# Patient Record
Sex: Male | Born: 1970 | Race: Black or African American | Hispanic: No | Marital: Single | Smoking: Light tobacco smoker
Health system: Southern US, Community
[De-identification: ages and names within clinical notes are randomized; demographics above are authoritative.]

---

## 1999-01-10 ENCOUNTER — Emergency Department (HOSPITAL_COMMUNITY): Admission: EM | Admit: 1999-01-10 | Discharge: 1999-01-10 | Payer: Self-pay | Admitting: Emergency Medicine

## 1999-01-10 ENCOUNTER — Encounter: Payer: Self-pay | Admitting: Emergency Medicine

## 1999-10-20 ENCOUNTER — Emergency Department (HOSPITAL_COMMUNITY): Admission: EM | Admit: 1999-10-20 | Discharge: 1999-10-20 | Payer: Self-pay | Admitting: *Deleted

## 1999-10-20 ENCOUNTER — Encounter: Payer: Self-pay | Admitting: *Deleted

## 2001-08-11 ENCOUNTER — Emergency Department (HOSPITAL_COMMUNITY): Admission: EM | Admit: 2001-08-11 | Discharge: 2001-08-11 | Payer: Self-pay | Admitting: Emergency Medicine

## 2003-12-26 ENCOUNTER — Emergency Department (HOSPITAL_COMMUNITY): Admission: EM | Admit: 2003-12-26 | Discharge: 2003-12-26 | Payer: Self-pay | Admitting: Emergency Medicine

## 2006-12-11 ENCOUNTER — Emergency Department (HOSPITAL_COMMUNITY): Admission: EM | Admit: 2006-12-11 | Discharge: 2006-12-11 | Payer: Self-pay | Admitting: Family Medicine

## 2006-12-13 ENCOUNTER — Emergency Department (HOSPITAL_COMMUNITY): Admission: EM | Admit: 2006-12-13 | Discharge: 2006-12-13 | Payer: Self-pay | Admitting: Emergency Medicine

## 2006-12-15 ENCOUNTER — Emergency Department (HOSPITAL_COMMUNITY): Admission: EM | Admit: 2006-12-15 | Discharge: 2006-12-15 | Payer: Self-pay | Admitting: Family Medicine

## 2008-08-20 IMAGING — CR DG FOOT COMPLETE 3+V*L*
3 series · 3 of 3 positions shown · non-contrast
Comparison: None.

CLINICAL DATA: Left foot injury.  Stepped on a pipe [REDACTED] with pain. 
 LEFT FOOT ? 3 VIEW:

[view not recorded (1 of 3)]
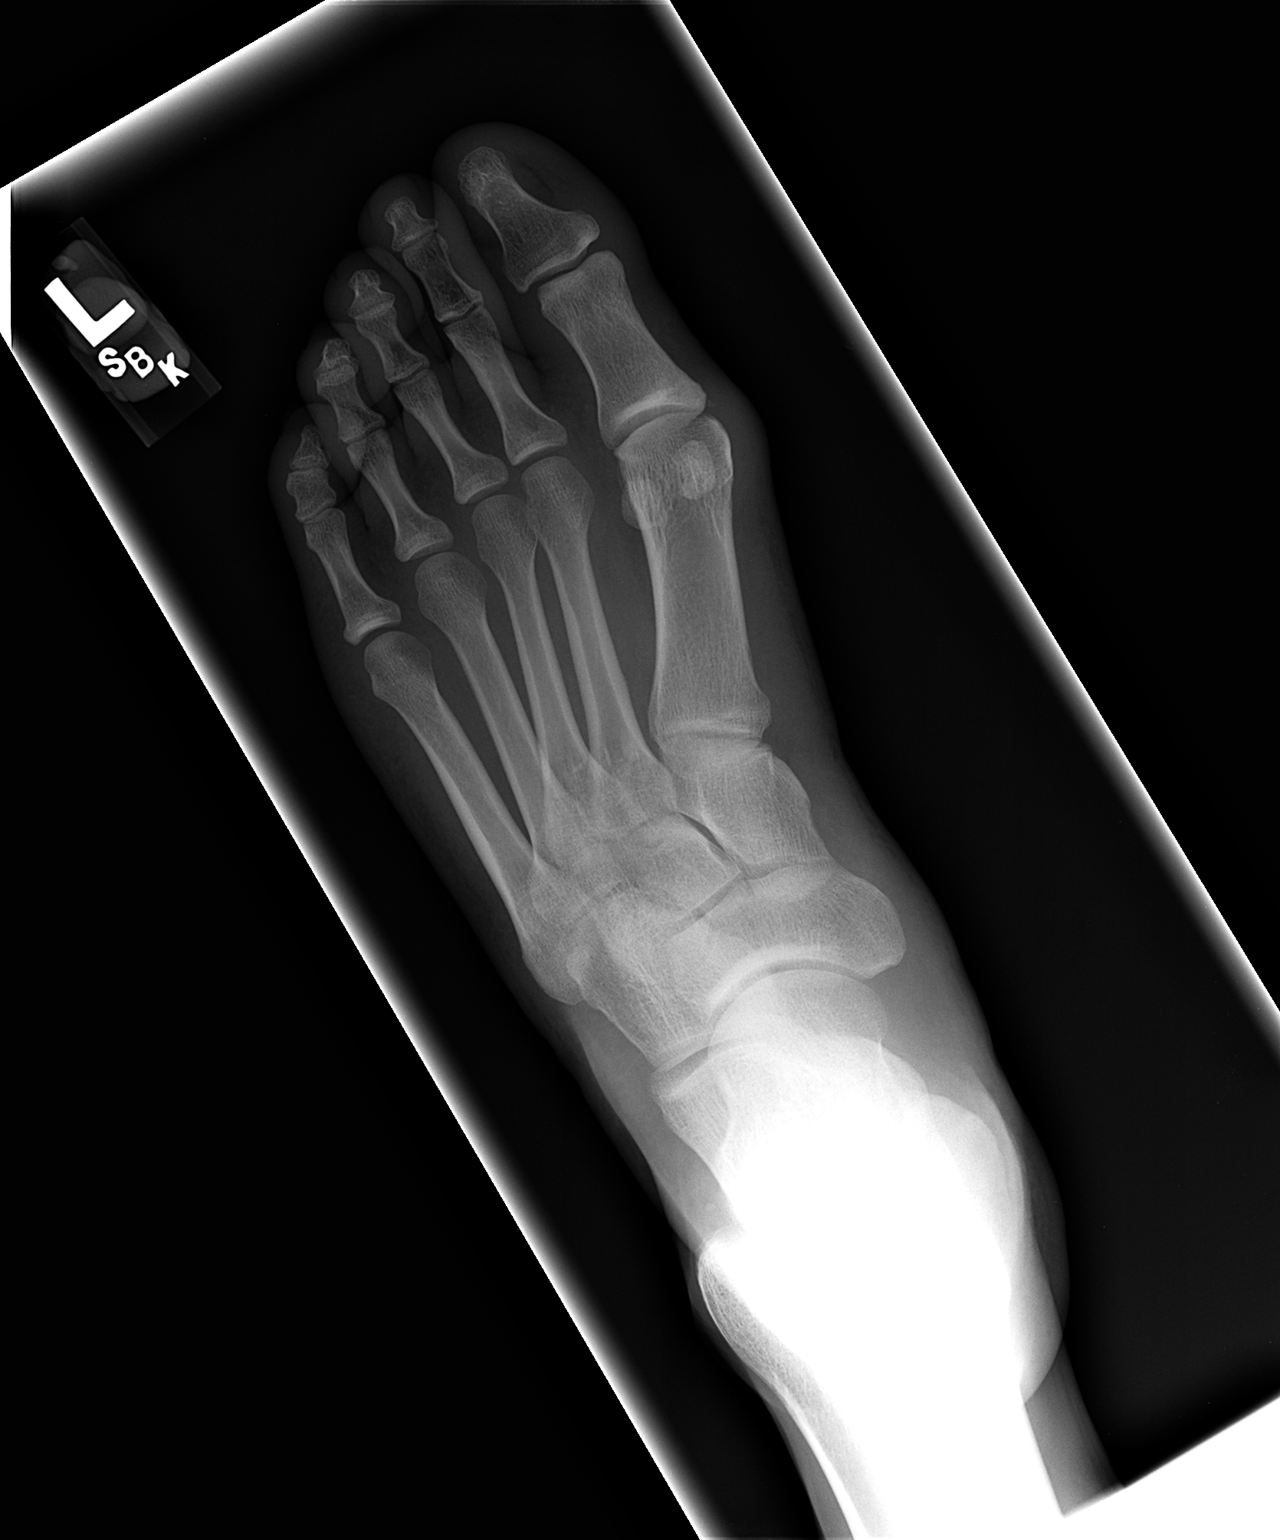

[view not recorded (2 of 3)]
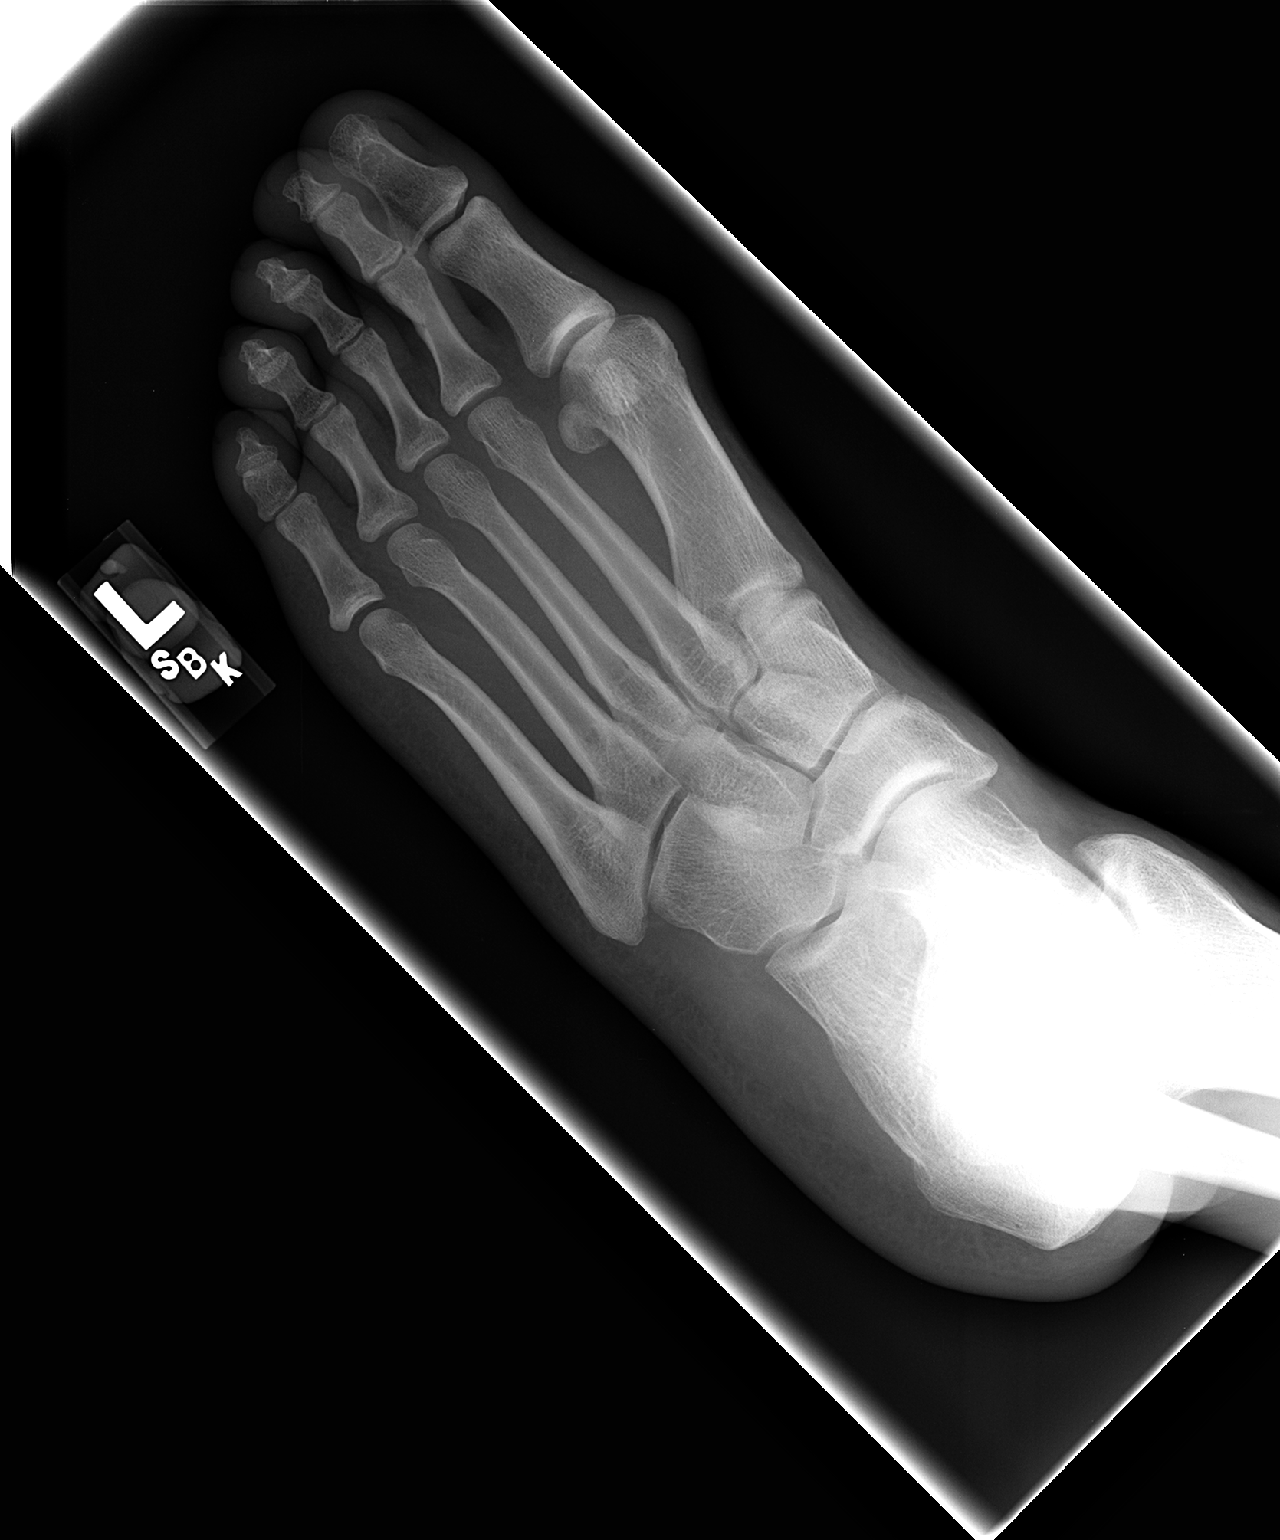

[view not recorded (3 of 3)]
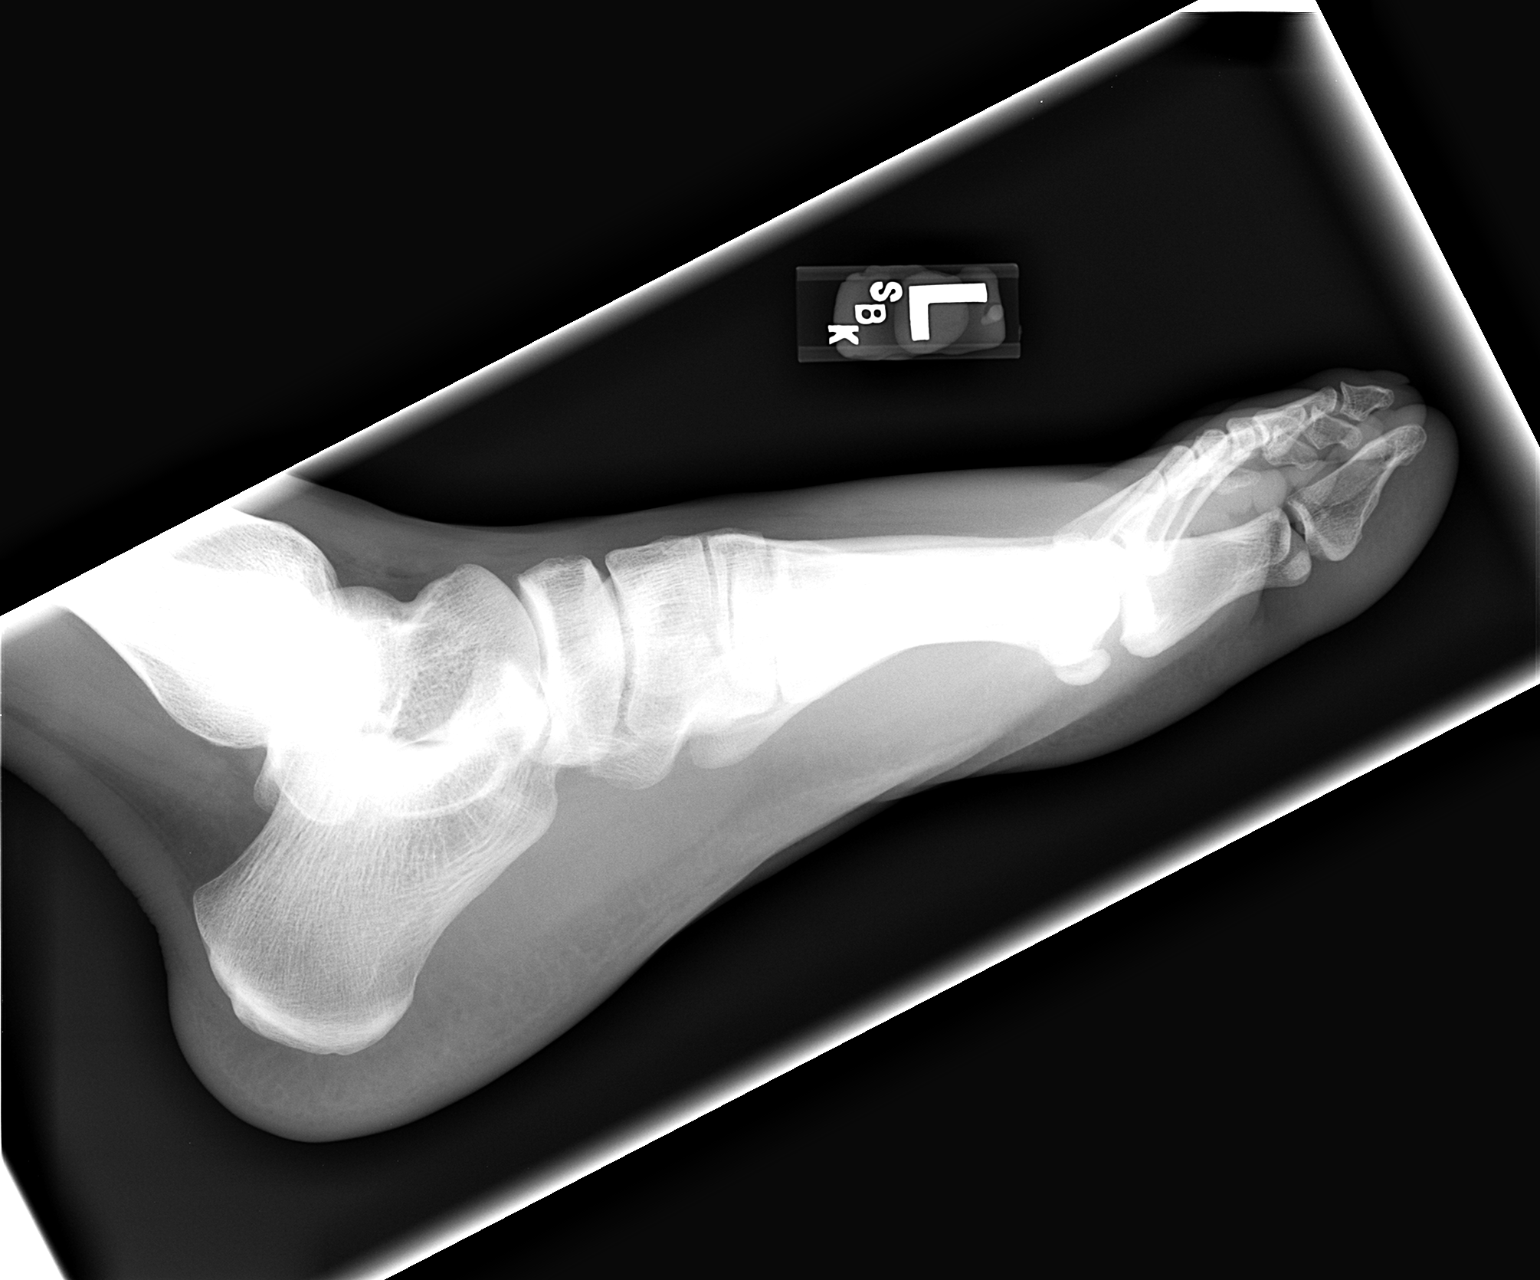

[3 of 3 positions shown; findings below may reference images not displayed]

FINDINGS: Mild soft tissue swelling along the 1st metatarsophalangeal joint.  Soft tissue swelling about the forefoot.  No radiopaque foreign body or fracture.
IMPRESSION: Forefoot soft tissue swelling without fracture or radiopaque foreign body.

## 2009-12-14 ENCOUNTER — Emergency Department (HOSPITAL_COMMUNITY): Admission: EM | Admit: 2009-12-14 | Discharge: 2009-12-14 | Payer: Self-pay | Admitting: Emergency Medicine

## 2009-12-20 ENCOUNTER — Emergency Department (HOSPITAL_COMMUNITY): Admission: EM | Admit: 2009-12-20 | Discharge: 2009-12-20 | Payer: Self-pay | Admitting: Family Medicine

## 2011-02-04 LAB — WOUND CULTURE

## 2013-08-23 ENCOUNTER — Emergency Department (HOSPITAL_COMMUNITY)
Admission: EM | Admit: 2013-08-23 | Discharge: 2013-08-23 | Disposition: A | Discharge status: Home / Self Care | Payer: Self-pay | Attending: Emergency Medicine | Admitting: Emergency Medicine

## 2013-08-23 ENCOUNTER — Encounter (HOSPITAL_COMMUNITY): Payer: Self-pay | Admitting: Emergency Medicine

## 2013-08-23 DIAGNOSIS — F17200 Nicotine dependence, unspecified, uncomplicated: Secondary | ICD-10-CM | POA: Insufficient documentation

## 2013-08-23 DIAGNOSIS — X500XXA Overexertion from strenuous movement or load, initial encounter: Secondary | ICD-10-CM | POA: Insufficient documentation

## 2013-08-23 DIAGNOSIS — Y990 Civilian activity done for income or pay: Secondary | ICD-10-CM | POA: Insufficient documentation

## 2013-08-23 DIAGNOSIS — Y9389 Activity, other specified: Secondary | ICD-10-CM | POA: Insufficient documentation

## 2013-08-23 DIAGNOSIS — IMO0002 Reserved for concepts with insufficient information to code with codable children: Secondary | ICD-10-CM | POA: Insufficient documentation

## 2013-08-23 DIAGNOSIS — Z880 Allergy status to penicillin: Secondary | ICD-10-CM | POA: Insufficient documentation

## 2013-08-23 DIAGNOSIS — Y9289 Other specified places as the place of occurrence of the external cause: Secondary | ICD-10-CM | POA: Insufficient documentation

## 2013-08-23 DIAGNOSIS — M549 Dorsalgia, unspecified: Secondary | ICD-10-CM

## 2013-08-23 MED ORDER — HYDROCODONE-ACETAMINOPHEN 5-325 MG PO TABS
1.0000 | ORAL_TABLET | Freq: Once | ORAL | Status: AC
  Administered 2013-08-23: 1 via ORAL
  Filled 2013-08-23: qty 1

## 2013-08-23 MED ORDER — KETOROLAC TROMETHAMINE 60 MG/2ML IM SOLN
30.0000 mg | Freq: Once | INTRAMUSCULAR | Status: AC
  Administered 2013-08-23: 30 mg via INTRAMUSCULAR
  Filled 2013-08-23: qty 2

## 2013-08-23 MED ORDER — HYDROCODONE-ACETAMINOPHEN 5-325 MG PO TABS: ORAL_TABLET | ORAL | Status: AC

## 2013-08-23 NOTE — Discharge Instructions (Signed)
Take vicodin for breakthrough pain, do not drink alcohol, drive, care for children or do other critical tasks while taking vicodin.  Please follow with your primary care doctor in the next 2 days for a check-up. They must obtain records for further management.   Do not hesitate to return to the Emergency Department for any new, worsening or concerning symptoms.    Back Pain, Adult Back pain is very common. The pain often gets better over time. The cause of back pain is usually not dangerous. Most people can learn to manage their back pain on their own.  HOME CARE   Stay active. Start with short walks on flat ground if you can. Try to walk farther each day.  Do not sit, drive, or stand in one place for more than 30 minutes. Do not stay in bed.  Do not avoid exercise or work. Activity can help your back heal faster.  Be careful when you bend or lift an object. Bend at your knees, keep the object close to you, and do not twist.  Sleep on a firm mattress. Lie on your side, and bend your knees. If you lie on your back, put a pillow under your knees.  Only take medicines as told by your doctor.  Put ice on the injured area.  Put ice in a plastic bag.  Place a towel between your skin and the bag.  Leave the ice on for 15-20 minutes, 03-04 times a day for the first 2 to 3 days. After that, you can switch between ice and heat packs.  Ask your doctor about back exercises or massage.  Avoid feeling anxious or stressed. Find good ways to deal with stress, such as exercise. GET HELP RIGHT AWAY IF:   Your pain does not go away with rest or medicine.  Your pain does not go away in 1 week.  You have new problems.  You do not feel well.  The pain spreads into your legs.  You cannot control when you poop (bowel movement) or pee (urinate).  Your arms or legs feel weak or lose feeling (numbness).  You feel sick to your stomach (nauseous) or throw up (vomit).  You have belly  (abdominal) pain.  You feel like you may pass out (faint). MAKE SURE YOU:   Understand these instructions.  Will watch your condition.  Will get help right away if you are not doing well or get worse. Document Released: 09/28/2007 Document Revised: 07/04/2011 Document Reviewed: 08/30/2010 [facility] Patient Information 2014 [place], [place].

## 2013-08-23 NOTE — ED Provider Notes (Signed)
CSN: [id number]     Arrival date & time 08/23/13  1844 History  This chart was scribed for non-physician practitioner, Rickey EmeryNicole Jillisa Harris, PA-C working with Rickey Lyonsouglas Delo, MD by Rickey House, ED scribe. This patient was seen in room TR10C/TR10C and the patient's care was started at 7:34 PM.     Chief Complaint  Patient presents with  . Back Pain   The history is provided by the patient. No language interpreter was used.   HPI Comments: Rickey House is a 43 y.o. male who presents to the Emergency Department complaining of sudden onset sharp, upper back pain that started earlier today at work. He states he turned to lift something and felt a pop in his back. Rates pain 9/10. Certain movements worsen the pain. Denies history of heart problems, nausea vomiting, weakness, chest pain, shortness of breath.  History reviewed. No pertinent past medical history. History reviewed. No pertinent past surgical history. No family history on file. History  Substance Use Topics  . Smoking status: Light Tobacco Smoker    Types: Cigarettes  . Smokeless tobacco: Not on file  . Alcohol Use: Yes     Comment: occ    Review of Systems  Musculoskeletal: Positive for back pain.  All other systems reviewed and are negative.  Allergies  Penicillins  Home Medications   Prior to Admission medications   Not on File   BP 109/78  Pulse 73  Temp(Src) 98.4 F (36.9 C) (Oral)  Resp 18  Ht 5\' 9"  (1.753 m)  Wt 133 lb (60.328 kg)  BMI 19.63 kg/m2  SpO2 100%  Physical Exam  Nursing note and vitals reviewed. Constitutional: He is oriented to person, place, and time. He appears well-developed and well-nourished. No distress.  HENT:  Head: Normocephalic.  Eyes: Conjunctivae and EOM are normal.  Cardiovascular: Normal rate.   Pulmonary/Chest: Effort normal. No stridor.  Musculoskeletal: Normal range of motion.  No midline spine tenderness or step offs. Mild tenderness to palpation of left paraspinal  musculature in the thoracic region.   Neurological: He is alert and oriented to person, place, and time.  Psychiatric: He has a normal mood and affect.    ED Course  Procedures (including critical care time)  DIAGNOSTIC STUDIES: Oxygen Saturation is 100% on RA, normal by my interpretation.    COORDINATION OF CARE: 7:38 PM-Discussed treatment plan which includes pain medication and an anti-inflammatory with pt at bedside and pt agreed to plan.   Labs Review Labs Reviewed - No data to display  Imaging Review No results found.   EKG Interpretation None      MDM   Final diagnoses:  Back pain    Filed Vitals:   08/23/13 1851 08/23/13 1944 08/23/13 2015  BP: 121/85 128/87 109/78  Pulse: 79 72 73  Temp: 98 F (36.7 C) 98.4 F (36.9 C)   TempSrc: Oral Oral   Resp: 20 18   Height: 5\' 9"  (1.753 m)    Weight: 133 lb (60.328 kg)    SpO2: 100% 98% 100%    Medications  ketorolac (TORADOL) injection 30 mg (30 mg Intramuscular Given 08/23/13 1948)  HYDROcodone-acetaminophen (NORCO/VICODIN) 5-325 MG per tablet 1 tablet (1 tablet Oral Given 08/23/13 1948)    Rickey House is a 43 y.o. male presenting with back pain after turning sharply. Likely muscular spasm.  Evaluation does not show pathology that would require ongoing emergent intervention or inpatient treatment. Pt is hemodynamically stable and mentating appropriately. Discussed findings and plan  with patient/guardian, who agrees with care plan. All questions answered. Return precautions discussed and outpatient follow up given.   Discharge Medication List as of 08/23/2013  7:47 PM    START taking these medications   Details  HYDROcodone-acetaminophen (NORCO/VICODIN) 5-325 MG per tablet Take 1-2 tablets by mouth every 6 hours as needed for pain., Print        Note: Portions of this report may have been transcribed using voice recognition software. Every effort was made to ensure accuracy; however, inadvertent computerized  transcription errors may be present   I personally performed the services described in this documentation, which was scribed in my presence. The recorded information has been reviewed and is accurate.  Rickey Emeryicole Marilu Rylander, PA-C 08/28/13 1545

## 2013-08-23 NOTE — ED Notes (Signed)
Pt sates he turned to lift something at work and he felt a "pop" like "bones rubbing together real hard".  Now has difficulty walking d/t pain.

## 2013-08-31 NOTE — ED Provider Notes (Signed)
Medical screening examination/treatment/procedure(s) were performed by non-physician practitioner and as supervising physician I was immediately available for consultation/collaboration.     Adelai Achey, MD 08/31/13 1930
